# Patient Record
Sex: Female | Born: 1950 | Race: White | Hispanic: No | Marital: Married | Smoking: Never smoker
Health system: Southern US, Community
[De-identification: ages and names within clinical notes are randomized; demographics above are authoritative.]

## PROBLEM LIST (undated history)

## (undated) DIAGNOSIS — K635 Polyp of colon: Secondary | ICD-10-CM

## (undated) DIAGNOSIS — K219 Gastro-esophageal reflux disease without esophagitis: Secondary | ICD-10-CM

## (undated) DIAGNOSIS — I341 Nonrheumatic mitral (valve) prolapse: Secondary | ICD-10-CM

## (undated) HISTORY — DX: Polyp of colon: K63.5

## (undated) HISTORY — DX: Gastro-esophageal reflux disease without esophagitis: K21.9

## (undated) HISTORY — PX: TONSILLECTOMY: SUR1361

## (undated) HISTORY — PX: APPENDECTOMY: SHX54

## (undated) HISTORY — DX: Nonrheumatic mitral (valve) prolapse: I34.1

---

## 1998-03-13 ENCOUNTER — Other Ambulatory Visit: Admission: RE | Admit: 1998-03-13 | Discharge: 1998-03-13 | Payer: Self-pay | Admitting: Gynecology

## 1998-07-31 ENCOUNTER — Other Ambulatory Visit: Admission: RE | Admit: 1998-07-31 | Discharge: 1998-07-31 | Payer: Self-pay | Admitting: Gynecology

## 1999-06-04 ENCOUNTER — Other Ambulatory Visit: Admission: RE | Admit: 1999-06-04 | Discharge: 1999-06-04 | Payer: Self-pay | Admitting: Gynecology

## 2000-06-09 ENCOUNTER — Other Ambulatory Visit: Admission: RE | Admit: 2000-06-09 | Discharge: 2000-06-09 | Payer: Self-pay | Admitting: Gynecology

## 2000-07-08 ENCOUNTER — Other Ambulatory Visit: Admission: RE | Admit: 2000-07-08 | Discharge: 2000-07-08 | Payer: Self-pay | Admitting: Gynecology

## 2000-07-08 ENCOUNTER — Encounter (INDEPENDENT_AMBULATORY_CARE_PROVIDER_SITE_OTHER): Payer: Self-pay | Admitting: Specialist

## 2008-03-15 ENCOUNTER — Encounter: Admission: RE | Admit: 2008-03-15 | Discharge: 2008-03-15 | Payer: Self-pay | Admitting: Obstetrics and Gynecology

## 2008-08-26 ENCOUNTER — Emergency Department (HOSPITAL_COMMUNITY): Admission: EM | Admit: 2008-08-26 | Discharge: 2008-08-26 | Payer: Self-pay | Admitting: Emergency Medicine

## 2009-10-07 IMAGING — CR DG CHEST 1V PORT
1 series · 1 of 1 positions shown · non-contrast
Comparison: None

CLINICAL DATA: Chest pain

PORTABLE CHEST - 1 VIEW

[view not recorded]
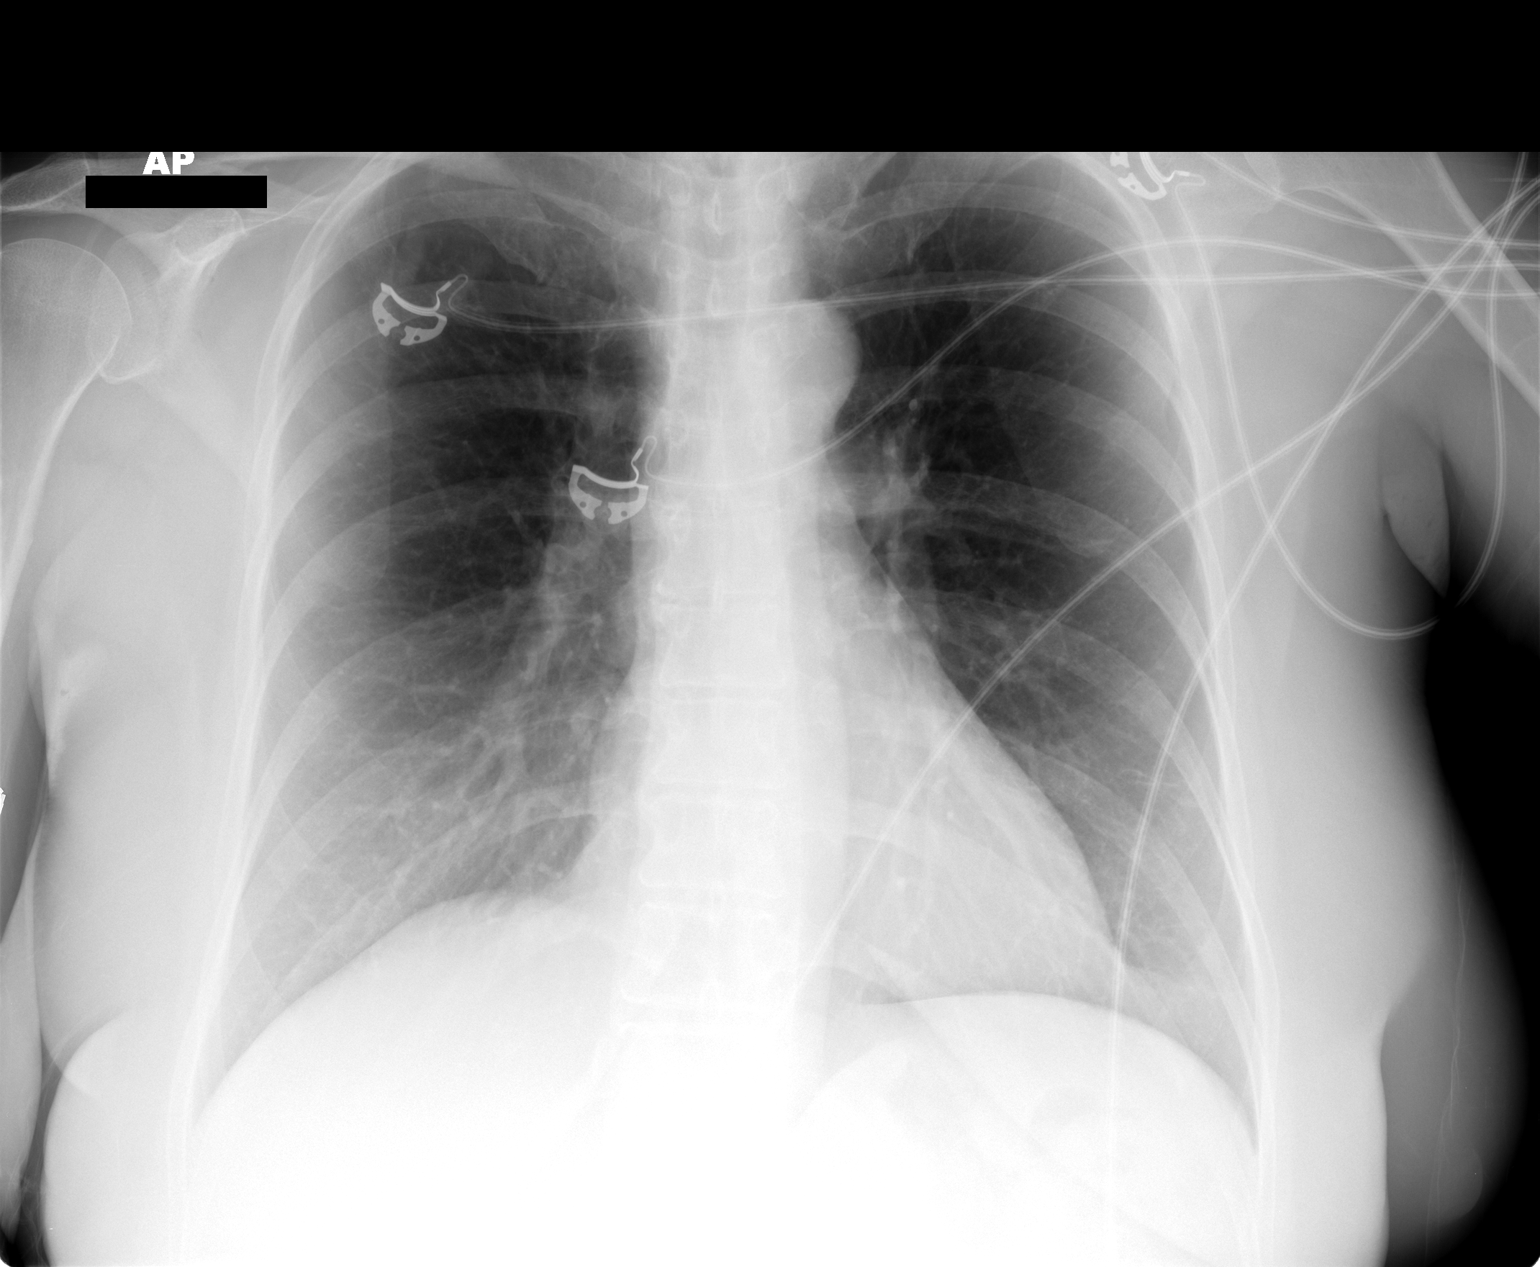

[1 of 1 positions shown; findings below may reference images not displayed]

FINDINGS: Cardiomediastinal silhouette is unremarkable.  No acute
infiltrate or pleural effusion.  No pulmonary edema.  Bony thorax
is unremarkable.
IMPRESSION: No active disease.

## 2010-07-15 LAB — CBC
MCHC: 35.2 g/dL (ref 30.0–36.0)
Platelets: 210 10*3/uL (ref 150–400)
RBC: 4.51 MIL/uL (ref 3.87–5.11)
WBC: 6.4 10*3/uL (ref 4.0–10.5)

## 2010-07-15 LAB — COMPREHENSIVE METABOLIC PANEL
ALT: 22 U/L (ref 0–35)
AST: 22 U/L (ref 0–37)
Albumin: 4.5 g/dL (ref 3.5–5.2)
CO2: 28 mEq/L (ref 19–32)
Calcium: 9.7 mg/dL (ref 8.4–10.5)
Chloride: 105 mEq/L (ref 96–112)
GFR calc Af Amer: >60 mL/min (ref >60)
GFR calc non Af Amer: >60 mL/min (ref >60)
Sodium: 140 mEq/L (ref 135–145)

## 2010-07-15 LAB — POCT CARDIAC MARKERS: Troponin i, poc: <0.05 ng/mL (ref 0.00–0.09)

## 2010-07-15 LAB — DIFFERENTIAL
Eosinophils Absolute: 0.2 10*3/uL (ref 0.0–0.7)
Eosinophils Relative: 3 % (ref 0–5)
Lymphs Abs: 2.2 10*3/uL (ref 0.7–4.0)
Monocytes Absolute: 0.5 10*3/uL (ref 0.1–1.0)

## 2013-08-09 ENCOUNTER — Encounter (INDEPENDENT_AMBULATORY_CARE_PROVIDER_SITE_OTHER): Payer: Self-pay | Admitting: *Deleted

## 2013-08-11 ENCOUNTER — Encounter (INDEPENDENT_AMBULATORY_CARE_PROVIDER_SITE_OTHER): Payer: Self-pay | Admitting: Internal Medicine

## 2013-08-11 ENCOUNTER — Ambulatory Visit (INDEPENDENT_AMBULATORY_CARE_PROVIDER_SITE_OTHER): Payer: BC Managed Care – PPO | Admitting: Internal Medicine

## 2013-08-11 VITALS — BP 130/70 | HR 60 | Temp 98.3°F | Ht 67.0 in | Wt 179.3 lb

## 2013-08-11 DIAGNOSIS — K2270 Barrett's esophagus without dysplasia: Secondary | ICD-10-CM | POA: Insufficient documentation

## 2013-08-11 DIAGNOSIS — I341 Nonrheumatic mitral (valve) prolapse: Secondary | ICD-10-CM | POA: Insufficient documentation

## 2013-08-11 DIAGNOSIS — K219 Gastro-esophageal reflux disease without esophagitis: Secondary | ICD-10-CM | POA: Insufficient documentation

## 2013-08-11 MED ORDER — PANTOPRAZOLE SODIUM 40 MG PO TBEC: 40.0000 mg | DELAYED_RELEASE_TABLET | Freq: Every day | ORAL | Status: DC

## 2013-08-11 NOTE — Progress Notes (Signed)
Subjective:     Patient ID: Gloria RumpfJanice M Castro, female   DOB: Oct 22, 1950, 63 y.o.   MRN: [id number]  HPI Referred to our office by Dr. Esperanza RichtersGretchen Atkins for GI. Prior patient of Dr. Elder CyphersShiflett. 11/10/2005 Colonoscopy; Hyperplastic polyp transverse colon.  07/08/2012 EGD: Large hiatal hernia and there is obvious short segment Barrett's, columnar epithelium cm above the Z-line. Biopsies of the columnar area. No active erosions. Stomach, pyloric channel, and duodenum otherwise normal to the second and 3rd portions of the duodenum. She tells me the biopsy showed it was not Barrett's.  Biopsy: benign fragments of mildly chronically inflamed GE mucosa, distal esophagus. 2014 Colonoscopy: Normal. Appetite is good.  No unintentional weight loss. Acid reflux controlled with PPI. She says she is always hoarse in the am.  She sleeps on a wedge pillow.    Review of Systems Married, 3 girls in good health. Works as a Armed forces operational officerdental hygienist     Past Medical History  Diagnosis Date  . Colon polyps   . MVP (mitral valve prolapse)   . GERD (gastroesophageal reflux disease)     Past Surgical History  Procedure Laterality Date  . Appendectomy    . Tonsillectomy      Allergies  Allergen Reactions  . Augmentin [Amoxicillin-Pot Clavulanate]     No current outpatient prescriptions on file prior to visit.   No current facility-administered medications on file prior to visit.     Objective:   Physical Exam  Filed Vitals:   08/11/13 0909  BP: 130/70  Pulse: 60  Temp: 98.3 F (36.8 C)  Height: 5\' 7"  (1.702 m)  Weight: 179 lb 4.8 oz (81.33 kg)   Alert and oriented. Skin warm and dry. Oral mucosa is moist.   . Sclera anicteric, conjunctivae is pink. Thyroid not enlarged. No cervical lymphadenopathy. Lungs clear. Heart regular rate and rhythm.  Abdomen is soft. Bowel sounds are positive. No hepatomegaly. No abdominal masses felt. No tenderness.  No edema to lower extremities.       Assessment:    GERD.   Controlled at this time. She however has hoarseness in the am. Hx of large hiatal hernia. Barrett's esophagus.     Plan:    Am going to discuss with Dr Karilyn Cotaehman. Possible repeat EGD. Biopsy says negative for Barrett's. Dr.Shefliett dictations says obvious Barrett's on EGD. OV in one year. Am going to switch her to Protonix 40mg  daily, #11.

## 2013-08-11 NOTE — Patient Instructions (Signed)
Will discuss with Dr. Karilyn Cotaehman concerning repeat

## 2013-08-17 ENCOUNTER — Telehealth (INDEPENDENT_AMBULATORY_CARE_PROVIDER_SITE_OTHER): Payer: Self-pay | Admitting: Internal Medicine

## 2013-08-17 NOTE — Telephone Encounter (Signed)
2 yr EGD noted

## 2013-08-17 NOTE — Telephone Encounter (Signed)
I advised her of her need for surveillance EGD in 2 yrs.   Ann, please put on a recall for an EGD in 2 yrs. 2017. Dx Barrett's esophagus.

## 2014-01-30 DIAGNOSIS — M75110 Incomplete rotator cuff tear or rupture of unspecified shoulder, not specified as traumatic: Secondary | ICD-10-CM | POA: Insufficient documentation

## 2014-02-13 DIAGNOSIS — M7521 Bicipital tendinitis, right shoulder: Secondary | ICD-10-CM | POA: Insufficient documentation

## 2014-04-03 ENCOUNTER — Encounter (INDEPENDENT_AMBULATORY_CARE_PROVIDER_SITE_OTHER): Payer: Self-pay

## 2015-08-20 ENCOUNTER — Encounter (INDEPENDENT_AMBULATORY_CARE_PROVIDER_SITE_OTHER): Payer: Self-pay | Admitting: *Deleted

## 2016-06-17 ENCOUNTER — Other Ambulatory Visit (INDEPENDENT_AMBULATORY_CARE_PROVIDER_SITE_OTHER): Payer: Self-pay | Admitting: *Deleted

## 2016-06-17 DIAGNOSIS — K2270 Barrett's esophagus without dysplasia: Secondary | ICD-10-CM | POA: Insufficient documentation

## 2016-07-10 ENCOUNTER — Telehealth (INDEPENDENT_AMBULATORY_CARE_PROVIDER_SITE_OTHER): Payer: Self-pay | Admitting: *Deleted

## 2016-07-10 ENCOUNTER — Encounter (INDEPENDENT_AMBULATORY_CARE_PROVIDER_SITE_OTHER): Payer: Self-pay | Admitting: *Deleted

## 2016-07-10 MED ORDER — PEG 3350-KCL-NA BICARB-NACL 420 G PO SOLR
4000.0000 mL | Freq: Once | ORAL | 0 refills | Status: AC
Start: 2016-07-10 — End: 2016-07-10

## 2016-07-10 NOTE — Telephone Encounter (Signed)
Patient needs trilyte 

## 2016-07-21 ENCOUNTER — Telehealth (INDEPENDENT_AMBULATORY_CARE_PROVIDER_SITE_OTHER): Payer: Self-pay | Admitting: *Deleted

## 2016-07-21 NOTE — Telephone Encounter (Signed)
Referring MD/PCP: davis   Procedure: egd  Reason/Indication:  Barrett's esophagus  Has patient had this procedure before?  Yes, 2014 -- scanned  If so, when, by whom and where?    Is there a family history of colon cancer?    Who?  What age when diagnosed?    Is patient diabetic?   no      Does patient have prosthetic heart valve or mechanical valve?  no  Do you have a pacemaker?  no  Has patient ever had endocarditis? no  Has patient had joint replacement within last 12 months?  no  Does patient tend to be constipated or take laxatives?   Does patient have a history of alcohol/drug use?    Is patient on Coumadin, Plavix and/or Aspirin? yes  Medications: asa 81 mg daily, atorvastatin 20 mg 2 tab weekly  Allergies: augmentin  Medication Adjustment per Dr Karilyn Cota: asa 2 days  Procedure date & time: 08/19/16 at 1200

## 2016-07-22 ENCOUNTER — Encounter (INDEPENDENT_AMBULATORY_CARE_PROVIDER_SITE_OTHER): Payer: Self-pay | Admitting: *Deleted

## 2016-07-22 NOTE — Telephone Encounter (Signed)
agree

## 2016-08-19 ENCOUNTER — Ambulatory Visit (HOSPITAL_COMMUNITY)
Admission: RE | Admit: 2016-08-19 | Discharge: 2016-08-19 | Disposition: A | Discharge status: Home / Self Care | Payer: Medicare Other | Source: Ambulatory Visit | Attending: Internal Medicine | Admitting: Internal Medicine

## 2016-08-19 ENCOUNTER — Encounter (HOSPITAL_COMMUNITY)
Admission: RE | Disposition: A | Discharge status: Home / Self Care | Payer: Self-pay | Source: Ambulatory Visit | Attending: Internal Medicine

## 2016-08-19 ENCOUNTER — Encounter (HOSPITAL_COMMUNITY): Payer: Self-pay | Admitting: *Deleted

## 2016-08-19 DIAGNOSIS — Z79899 Other long term (current) drug therapy: Secondary | ICD-10-CM | POA: Insufficient documentation

## 2016-08-19 DIAGNOSIS — K219 Gastro-esophageal reflux disease without esophagitis: Secondary | ICD-10-CM | POA: Insufficient documentation

## 2016-08-19 DIAGNOSIS — Z7982 Long term (current) use of aspirin: Secondary | ICD-10-CM | POA: Insufficient documentation

## 2016-08-19 DIAGNOSIS — K2270 Barrett's esophagus without dysplasia: Secondary | ICD-10-CM | POA: Insufficient documentation

## 2016-08-19 DIAGNOSIS — K228 Other specified diseases of esophagus: Secondary | ICD-10-CM

## 2016-08-19 HISTORY — PX: ESOPHAGOGASTRODUODENOSCOPY: SHX5428

## 2016-08-19 SURGERY — EGD (ESOPHAGOGASTRODUODENOSCOPY)
Anesthesia: Moderate Sedation

## 2016-08-19 MED ORDER — MEPERIDINE HCL 50 MG/ML IJ SOLN
INTRAMUSCULAR | Status: AC
  Filled 2016-08-19: qty 1

## 2016-08-19 MED ORDER — MEPERIDINE HCL 50 MG/ML IJ SOLN
INTRAMUSCULAR | Status: DC | PRN
  Administered 2016-08-19 (×2): 25 mg via INTRAVENOUS

## 2016-08-19 MED ORDER — LIDOCAINE VISCOUS 2 % MT SOLN
OROMUCOSAL | Status: AC
  Filled 2016-08-19: qty 15

## 2016-08-19 MED ORDER — SODIUM CHLORIDE 0.9 % IV SOLN
INTRAVENOUS | Status: DC
  Administered 2016-08-19: 1000 mL via INTRAVENOUS

## 2016-08-19 MED ORDER — LIDOCAINE HCL 2 % EX GEL
CUTANEOUS | Status: DC | PRN
  Administered 2016-08-19: 1

## 2016-08-19 MED ORDER — MIDAZOLAM HCL 5 MG/5ML IJ SOLN
INTRAMUSCULAR | Status: DC | PRN
  Administered 2016-08-19 (×2): 2 mg via INTRAVENOUS

## 2016-08-19 MED ORDER — MIDAZOLAM HCL 5 MG/5ML IJ SOLN
INTRAMUSCULAR | Status: AC
  Filled 2016-08-19: qty 10

## 2016-08-19 NOTE — Op Note (Signed)
[facility] Patient Name: Gloria Castro Procedure Date: 08/19/2016 10:44 AM MRN: [id number] Date of Birth: 13-Apr-1950 Attending MD: Lionel December , MD CSN: [id number] Age: 66 Admit Type: Outpatient Procedure:                Upper GI endoscopy Indications:              Barrett's esophagus, Follow-up of Barrett's                            esophagus Providers:                Lionel December, MD, Toniann Fail RN, RN, Dyann Ruddle Referring MD:             Lamont Snowball, MD Medicines:                Lidocaine spray, Meperidine 50 mg IV, Midazolam 4                            mg IV Complications:            No immediate complications. Estimated Blood Loss:     Estimated blood loss was minimal. Procedure:                Pre-Anesthesia Assessment:                           - Prior to the procedure, a History and Physical                            was performed, and patient medications and                            allergies were reviewed. The patient's tolerance of                            previous anesthesia was also reviewed. The risks                            and benefits of the procedure and the sedation                            options and risks were discussed with the patient.                            All questions were answered, and informed consent                            was obtained. Prior Anticoagulants: The patient                            last took aspirin 3 days prior to the procedure.  ASA Grade Assessment: II - A patient with mild                            systemic disease. After reviewing the risks and                            benefits, the patient was deemed in satisfactory                            condition to undergo the procedure.                           After obtaining informed consent, the endoscope was                            passed under direct vision. Throughout the                  procedure, the patient's blood pressure, pulse, and                            oxygen saturations were monitored continuously. The                            EG-[id number] ([id number]) scope was introduced through the                            mouth, and advanced to the second part of duodenum.                            The upper GI endoscopy was accomplished without                            difficulty. The patient tolerated the procedure                            well. Scope In: 11:23:17 AM Scope Out: 11:30:59 AM Total Procedure Duration: 0 hours 7 minutes 42 seconds  Findings:      The proximal esophagus and mid esophagus were normal.      One tongue of salmon-colored mucosa was present from 38 to 39 cm. The       maximum longitudinal extent of these esophageal mucosal changes was 1 cm       in length. Biopsies were taken with a cold forceps for histology.      The Z-line was irregular and was found 39 cm from the incisors.      The entire examined stomach was normal.      The duodenal bulb and second portion of the duodenum were normal. Impression:               - Normal proximal esophagus and mid esophagus.                           - Salmon-colored mucosa suspicious for  short-segment Barrett's esophagus. Biopsied.                           - Z-line irregular, 39 cm from the incisors.                           - Normal stomach.                           - Normal duodenal bulb and second portion of the                            duodenum. Moderate Sedation:      Moderate (conscious) sedation was administered by the endoscopy nurse       and supervised by the endoscopist. The following parameters were       monitored: oxygen saturation, heart rate, blood pressure, CO2       capnography and response to care. Total physician intraservice time was       14 minutes. Recommendation:           - Patient has a contact number available for                             emergencies. The signs and symptoms of potential                            delayed complications were discussed with the                            patient. Return to normal activities tomorrow.                            Written discharge instructions were provided to the                            patient.                           - Resume previous diet today.                           - Continue present medications.                           - Resume aspirin at prior dose tomorrow.                           - Await pathology results.                           - Repeat upper endoscopy for surveillance based on                            pathology results. Procedure Code(s):        --- Professional ---  [id number], Esophagogastroduodenoscopy, flexible,                            transoral; with biopsy, single or multiple                           99152, Moderate sedation services provided by the                            same physician or other qualified health care                            professional performing the diagnostic or                            therapeutic service that the sedation supports,                            requiring the presence of an independent trained                            observer to assist in the monitoring of the                            patient's level of consciousness and physiological                            status; initial 15 minutes of intraservice time,                            patient age 8 years or older Diagnosis Code(s):        --- Professional ---                           K22.70, Barrett's esophagus without dysplasia                           K22.8, Other specified diseases of esophagus CPT copyright 2016 American Medical Association. All rights reserved. The codes documented in this report are preliminary and upon coder review may  be revised to meet current compliance requirements. Lionel DecemberNajeeb  Lewayne Pauley, MD Lionel DecemberNajeeb Mykeria Garman, MD 08/19/2016 11:47:51 AM This report has been signed electronically. Number of Addenda: 0

## 2016-08-19 NOTE — Discharge Instructions (Signed)
Resume aspirin on 08/20/2016. Resume other medications and diet as before. No driving for 24 hours. Physician will call with biopsy results.     Esophagogastroduodenoscopy, Care After Refer to this sheet in the next few weeks. These instructions provide you with information about caring for yourself after your procedure. Your health care provider may also give you more specific instructions. Your treatment has been planned according to current medical practices, but problems sometimes occur. Call your health care provider if you have any problems or questions after your procedure. What can I expect after the procedure? After the procedure, it is common to have:  A sore throat.  Nausea.  Bloating.  Dizziness.  Fatigue. Follow these instructions at home:  Do not eat or drink anything until the numbing medicine (local anesthetic) has worn off and your gag reflex has returned. You will know that the local anesthetic has worn off when you can swallow comfortably.  Do not drive for 24 hours if you received a medicine to help you relax (sedative).  If your health care provider took a tissue sample for testing during the procedure, make sure to get your test results. This is your responsibility. Ask your health care provider or the department performing the test when your results will be ready.  Keep all follow-up visits as told by your health care provider. This is important. Contact a health care provider if:  You cannot stop coughing.  You are not urinating.  You are urinating less than usual. Get help right away if:  You have trouble swallowing.  You cannot eat or drink.  You have throat or chest pain that gets worse.  You are dizzy or light-headed.  You faint.  You have nausea or vomiting.  You have chills.  You have a fever.  You have severe abdominal pain.  You have black, tarry, or bloody stools. This information is not intended to replace advice given to  you by your health care provider. Make sure you discuss any questions you have with your health care provider. Document Released: 03/09/2012 Document Revised: 08/29/2015 Document Reviewed: 02/14/2015 Elsevier Interactive Patient Education  2017 [employer] Inc.    Hiatal Hernia A hiatal hernia occurs when part of the stomach slides above the muscle that separates the abdomen from the chest (diaphragm). A person can be born with a hiatal hernia (congenital), or it may develop over time. In almost all cases of hiatal hernia, only the top part of the stomach pushes through the diaphragm. Many people have a hiatal hernia with no symptoms. The larger the hernia, the more likely it is that you will have symptoms. In some cases, a hiatal hernia allows stomach acid to flow back into the tube that carries food from your mouth to your stomach (esophagus). This may cause heartburn symptoms. Severe heartburn symptoms may mean that you have developed a condition called gastroesophageal reflux disease (GERD). What are the causes? This condition is caused by a weakness in the opening (hiatus) where the esophagus passes through the diaphragm to attach to the upper part of the stomach. A person may be born with a weakness in the hiatus, or a weakness can develop over time. What increases the risk? This condition is more likely to develop in:  Older people. Age is a major risk factor for a hiatal hernia, especially if you are over the age of 65.  Pregnant women.  People who are overweight.  People who have frequent constipation. What are the signs or  symptoms? Symptoms of this condition usually develop in the form of GERD symptoms. Symptoms include:  Heartburn.  Belching.  Indigestion.  Trouble swallowing.  Coughing or wheezing.  Sore throat.  Hoarseness.  Chest pain.  Nausea and vomiting. How is this diagnosed? This condition may be diagnosed during testing for GERD. Tests that may be done  include:  X-rays of your stomach or chest.  An upper gastrointestinal (GI) series. This is an X-ray exam of your GI tract that is taken after you swallow a chalky liquid that shows up clearly on the X-ray.  Endoscopy. This is a procedure to look into your stomach using a thin, flexible tube that has a tiny camera and light on the end of it. How is this treated? This condition may be treated by:  Dietary and lifestyle changes to help reduce GERD symptoms.  Medicines. These may include:  Over-the-counter antacids.  Medicines that make your stomach empty more quickly.  Medicines that block the production of stomach acid (H2 blockers).  Stronger medicines to reduce stomach acid (proton pump inhibitors).  Surgery to repair the hernia, if other treatments are not helping. If you have no symptoms, you may not need treatment. Follow these instructions at home: Lifestyle and activity   Do not use any products that contain nicotine or tobacco, such as cigarettes and e-cigarettes. If you need help quitting, ask your health care provider.  Try to achieve and maintain a healthy body weight.  Avoid putting pressure on your abdomen. Anything that puts pressure on your abdomen increases the amount of acid that may be pushed up into your esophagus.  Avoid bending over, especially after eating.  Raise the head of your bed by putting blocks under the legs. This keeps your head and esophagus higher than your stomach.  Do not wear tight clothing around your chest or stomach.  Try not to strain when having a bowel movement, when urinating, or when lifting heavy objects. Eating and drinking   Avoid foods that can worsen GERD symptoms. These may include:  Fatty foods, like fried foods.  Citrus fruits, like oranges or lemon.  Other foods and drinks that contain acid, like orange juice or tomatoes.  Spicy food.  Chocolate.  Eat frequent small meals instead of three large meals a day. This  helps prevent your stomach from getting too full.  Eat slowly.  Do not lie down right after eating.  Do not eat 1-2 hours before bed.  Do not drink beverages with caffeine. These include cola, coffee, cocoa, and tea.  Do not drink alcohol. General instructions   Take over-the-counter and prescription medicines only as told by your health care provider.  Keep all follow-up visits as told by your health care provider. This is important. Contact a health care provider if:  Your symptoms are not controlled with medicines or lifestyle changes.  You are having trouble swallowing.  You have coughing or wheezing that will not go away. Get help right away if:  Your pain is getting worse.  Your pain spreads to your arms, neck, jaw, teeth, or back.  You have shortness of breath.  You sweat for no reason.  You feel sick to your stomach (nauseous) or you vomit.  You vomit blood.  You have bright red blood in your stools.  You have black, tarry stools. This information is not intended to replace advice given to you by your health care provider. Make sure you discuss any questions you have with your health  care provider. Document Released: 06/13/2003 Document Revised: 03/16/2016 Document Reviewed: 03/16/2016 Elsevier Interactive Patient Education  2017 [employer].

## 2016-08-19 NOTE — H&P (Signed)
Gloria RumpfJanice M Castro is an 66 y.o. female.   Chief Complaint: Patient is here for EGD. HPI: Patient is 66 year old Caucasian female was history of GERD, GERD with Barrett's esophagus who is here for surveillance colonoscopy. She states when her condition was diagnosed she did not have heartburn had coffee should not go away. Last EGD was in April 2014. She denies dysphagia nausea vomiting abdominal pain or melena. She does not smoke cigarettes or drink alcohol.  Past Medical History:  Diagnosis Date  . Colon polyps   . GERD (gastroesophageal reflux disease)   . MVP (mitral valve prolapse)     Past Surgical History:  Procedure Laterality Date  . APPENDECTOMY    . TONSILLECTOMY      History reviewed. No pertinent family history. Social History:  reports that she has never smoked. She has never used smokeless tobacco. She reports that she does not drink alcohol or use drugs.  Allergies:  Allergies  Allergen Reactions  . Augmentin [Amoxicillin-Pot Clavulanate]     Medications Prior to Admission  Medication Sig Dispense Refill  . aspirin EC 81 MG tablet Take 81 mg by mouth daily.    Marland Kitchen. atorvastatin (LIPITOR) 20 MG tablet Take 10 mg by mouth 4 (four) times a week.    . cholecalciferol (VITAMIN D) 1000 units tablet Take 1,000 Units by mouth daily.    Marland Kitchen. estradiol (ESTRACE) 0.1 MG/GM vaginal cream Place 1 Applicatorful vaginally 2 (two) times a week.    . famotidine (PEPCID) 40 MG tablet Take 40 mg by mouth 2 (two) times daily.    . Omega-3 Fatty Acids (FISH OIL) 1000 MG CAPS Take 1 capsule by mouth daily.    . Turmeric 500 MG CAPS Take 1 capsule by mouth daily.      No results found for this or any previous visit (from the past 48 hour(s)). No results found.  ROS  Blood pressure 128/69, pulse (!) 57, temperature 97.8 F (36.6 C), temperature source Oral, resp. rate 19, height 5\' 7"  (1.702 m), weight 175 lb (79.4 kg), SpO2 100 %. Physical Exam  Constitutional: She appears  well-developed and well-nourished.  HENT:  Mouth/Throat: Oropharynx is clear and moist.  Eyes: Conjunctivae are normal. No scleral icterus.  Neck: No thyromegaly present.  Cardiovascular: Normal rate, regular rhythm and normal heart sounds.   No murmur heard. Respiratory: Effort normal and breath sounds normal.  GI: Soft. She exhibits no distension and no mass. There is no tenderness.  Musculoskeletal: She exhibits no edema.  Lymphadenopathy:    She has no cervical adenopathy.  Neurological: She is alert.  Skin: Skin is warm and dry.     Assessment/Plan History of Barrett's esophagus. Evidence EGD.  Lionel DecemberNajeeb Phyllis Abelson, MD 08/19/2016, 11:15 AM

## 2016-08-25 ENCOUNTER — Encounter (HOSPITAL_COMMUNITY): Payer: Self-pay | Admitting: Internal Medicine

## 2016-08-27 ENCOUNTER — Telehealth (INDEPENDENT_AMBULATORY_CARE_PROVIDER_SITE_OTHER): Payer: Self-pay | Admitting: *Deleted

## 2016-08-27 NOTE — Telephone Encounter (Signed)
Patient was called and a message was left for her to call office regarding results.

## 2019-12-06 DIAGNOSIS — R87619 Unspecified abnormal cytological findings in specimens from cervix uteri: Secondary | ICD-10-CM | POA: Insufficient documentation

## 2019-12-06 DIAGNOSIS — M8580 Other specified disorders of bone density and structure, unspecified site: Secondary | ICD-10-CM | POA: Insufficient documentation

## 2020-05-02 ENCOUNTER — Encounter: Payer: Self-pay | Admitting: Podiatry

## 2020-05-02 ENCOUNTER — Ambulatory Visit: Payer: Medicare Other

## 2020-05-02 ENCOUNTER — Ambulatory Visit (INDEPENDENT_AMBULATORY_CARE_PROVIDER_SITE_OTHER): Payer: Medicare Other | Admitting: Podiatry

## 2020-05-02 ENCOUNTER — Other Ambulatory Visit: Payer: Self-pay

## 2020-05-02 ENCOUNTER — Encounter: Payer: PRIVATE HEALTH INSURANCE | Admitting: Podiatry

## 2020-05-02 ENCOUNTER — Ambulatory Visit (INDEPENDENT_AMBULATORY_CARE_PROVIDER_SITE_OTHER): Payer: Medicare Other

## 2020-05-02 DIAGNOSIS — M19071 Primary osteoarthritis, right ankle and foot: Secondary | ICD-10-CM | POA: Diagnosis not present

## 2020-05-02 DIAGNOSIS — E7800 Pure hypercholesterolemia, unspecified: Secondary | ICD-10-CM | POA: Insufficient documentation

## 2020-05-02 DIAGNOSIS — M778 Other enthesopathies, not elsewhere classified: Secondary | ICD-10-CM

## 2020-05-03 NOTE — Progress Notes (Signed)
  Subjective:  Patient ID: Gloria Castro, female    DOB: November 02, 1950,  MRN: [id number] HPI Chief Complaint  Patient presents with  . Foot Pain    Dorsal midfoot right - initially had heel pain plantarly last year, got an injection by Ortho, now pain across the top of the foot x few months, tried ice and Ibuprofen  . New Patient (Initial Visit)    70 y.o. female presents with the above complaint.   ROS: Denies fever chills nausea vomiting muscle aches pains calf pain back pain chest pain shortness of breath.  Past Medical History:  Diagnosis Date  . Colon polyps   . GERD (gastroesophageal reflux disease)   . MVP (mitral valve prolapse)    Past Surgical History:  Procedure Laterality Date  . APPENDECTOMY    . ESOPHAGOGASTRODUODENOSCOPY N/A 08/19/2016   Procedure: ESOPHAGOGASTRODUODENOSCOPY (EGD);  Surgeon: Malissa Hippo, MD;  Location: [facility];  Service: Endoscopy;  Laterality: N/A;  1200  . TONSILLECTOMY      Current Outpatient Medications:  .  atorvastatin (LIPITOR) 20 MG tablet, Take 10 mg by mouth 4 (four) times a week., Disp: , Rfl:  .  cholecalciferol (VITAMIN D) 1000 units tablet, Take 1,000 Units by mouth daily., Disp: , Rfl:  .  estradiol (ESTRACE) 0.1 MG/GM vaginal cream, Place 1 Applicatorful vaginally 2 (two) times a week., Disp: , Rfl:  .  famotidine (PEPCID) 40 MG tablet, Take 40 mg by mouth 2 (two) times daily., Disp: , Rfl:  .  Melatonin 3 MG CAPS, melatonin 3 mg capsule  Take by oral route., Disp: , Rfl:  .  Turmeric 500 MG CAPS, Take 1 capsule by mouth daily., Disp: , Rfl:   Allergies  Allergen Reactions  . Augmentin [Amoxicillin-Pot Clavulanate]    Review of Systems Objective:  There were no vitals filed for this visit.  General: Well developed, nourished, in no acute distress, alert and oriented x3   Dermatological: Skin is warm, dry and supple bilateral. Nails x 10 are well maintained; remaining integument appears unremarkable at this time.  There are no open sores, no preulcerative lesions, no rash or signs of infection present.  Vascular: Dorsalis Pedis artery and Posterior Tibial artery pedal pulses are 2/4 bilateral with immedate capillary fill time. Pedal hair growth present. No varicosities and no lower extremity edema present bilateral.   Neruologic: Grossly intact via light touch bilateral. Vibratory intact via tuning fork bilateral. Protective threshold with Semmes Wienstein monofilament intact to all pedal sites bilateral. Patellar and Achilles deep tendon reflexes 2+ bilateral. No Babinski or clonus noted bilateral.   Musculoskeletal: No gross boney pedal deformities bilateral. No pain, crepitus, or limitation noted with foot and ankle range of motion bilateral. Muscular strength 5/5 in all groups tested bilateral.  Gait: Unassisted, Nonantalgic.    Radiographs:  Radiographs taken today demonstrate an osseously mature individual with moderate osteopenia.  Significant osteoarthritic changes of the right second metatarsal cuneiform joint.  Dorsal spurring is noted.  Joint space narrowing is noted.    Assessment & Plan:   Assessment: Osteoarthritis second TMT joint.    Plan: Discussed options today including oral therapy topical nonsteroidal anti-inflammatory therapy and injection therapy.  We discussed appropriate shoe gear stretching exercise ice therapy shoe gear modifications.  I will follow-up with her on an as-needed basis.  At this point she only wants to know the diagnosis.     Shaleah Nissley T. [place], [place]

## 2020-05-13 NOTE — Progress Notes (Signed)
This encounter was created in error - please disregard.

## 2022-06-23 ENCOUNTER — Encounter (INDEPENDENT_AMBULATORY_CARE_PROVIDER_SITE_OTHER): Payer: Self-pay | Admitting: *Deleted

## 2022-07-08 ENCOUNTER — Telehealth (INDEPENDENT_AMBULATORY_CARE_PROVIDER_SITE_OTHER): Payer: Self-pay | Admitting: Gastroenterology

## 2022-07-08 NOTE — Telephone Encounter (Signed)
Any Room Thanks 

## 2022-07-08 NOTE — Telephone Encounter (Signed)
Who is your primary care physician: [facility]   Reasons for the colonoscopy: hx polyps  Have you had a colonoscopy before?  Yes 10 years ago  Do you have family history of colon cancer? no  Previous colonoscopy with polyps removed? Yes 20 years ago small flat polyp  Do you have a history colorectal cancer?   no  Are you diabetic? If yes, Type 1 or Type 2?    no  Do you have a prosthetic or mechanical heart valve? no  Do you have a pacemaker/defibrillator?   no  Have you had endocarditis/atrial fibrillation? no  Have you had joint replacement within the last 12 months?  no  Do you tend to be constipated or have to use laxatives? no  Do you have any history of drugs or alchohol?  no  Do you use supplemental oxygen?  no  Have you had a stroke or heart attack within the last 6 months? no  Do you take weight loss medication?  no  For female patients: have you had a hysterectomy?  no                                     are you post menopausal?       yes                                            do you still have your menstrual cycle? no      Do you take any blood-thinning medications such as: (aspirin, warfarin, Plavix, Aggrenox)  no  If yes we need the name, milligram, dosage and who is prescribing doctor  Current Outpatient Medications on File Prior to Visit  Medication Sig Dispense Refill   atorvastatin (LIPITOR) 20 MG tablet Take 10 mg by mouth 4 (four) times a week.     cholecalciferol (VITAMIN D) 1000 units tablet Take 1,000 Units by mouth daily.     estradiol (ESTRACE) 0.1 MG/GM vaginal cream Place 1 Applicatorful vaginally 2 (two) times a week.     famotidine (PEPCID) 40 MG tablet Take 40 mg by mouth 2 (two) times daily.     Melatonin 3 MG CAPS melatonin 3 mg capsule  Take by oral route.     Turmeric 500 MG CAPS Take 1 capsule by mouth daily.     No current facility-administered medications on file prior to visit.    Allergies  Allergen Reactions    Augmentin [Amoxicillin-Pot Clavulanate]      Pharmacy: [place]  Primary Insurance Name: Medicare, Pollie Meyer  Best number where you can be reached: [phone]

## 2022-07-09 MED ORDER — PEG 3350-KCL-NA BICARB-NACL 420 G PO SOLR: 4000.0000 mL | Freq: Once | ORAL | 0 refills | Status: AC

## 2022-07-09 NOTE — Telephone Encounter (Signed)
Left message to return call 

## 2022-07-09 NOTE — Telephone Encounter (Signed)
Pt returned call. Colonoscopy scheduled. Instructions mailed to patient. Prep sent to pharmacy.

## 2022-07-09 NOTE — Addendum Note (Signed)
Addended by: Vicente Males on: 07/09/2022 10:52 AM   Modules accepted: Orders

## 2022-07-13 NOTE — Telephone Encounter (Signed)
Questionnaire from recall, no referral needed  

## 2022-08-14 ENCOUNTER — Encounter (HOSPITAL_COMMUNITY)
Admission: RE | Disposition: A | Discharge status: Home / Self Care | Payer: Self-pay | Source: Home / Self Care | Attending: Gastroenterology

## 2022-08-14 ENCOUNTER — Encounter (HOSPITAL_COMMUNITY): Payer: Self-pay | Admitting: Gastroenterology

## 2022-08-14 ENCOUNTER — Ambulatory Visit (HOSPITAL_COMMUNITY)
Admission: RE | Admit: 2022-08-14 | Discharge: 2022-08-14 | Disposition: A | Discharge status: Home / Self Care | Payer: Medicare Other | Attending: Gastroenterology | Admitting: Gastroenterology

## 2022-08-14 ENCOUNTER — Ambulatory Visit (HOSPITAL_COMMUNITY): Payer: Medicare Other | Admitting: Certified Registered"

## 2022-08-14 ENCOUNTER — Other Ambulatory Visit: Payer: Self-pay

## 2022-08-14 ENCOUNTER — Ambulatory Visit (HOSPITAL_BASED_OUTPATIENT_CLINIC_OR_DEPARTMENT_OTHER): Payer: Medicare Other | Admitting: Certified Registered"

## 2022-08-14 DIAGNOSIS — Z1211 Encounter for screening for malignant neoplasm of colon: Secondary | ICD-10-CM | POA: Diagnosis present

## 2022-08-14 DIAGNOSIS — D125 Benign neoplasm of sigmoid colon: Secondary | ICD-10-CM | POA: Insufficient documentation

## 2022-08-14 DIAGNOSIS — I341 Nonrheumatic mitral (valve) prolapse: Secondary | ICD-10-CM | POA: Insufficient documentation

## 2022-08-14 DIAGNOSIS — Z79899 Other long term (current) drug therapy: Secondary | ICD-10-CM | POA: Insufficient documentation

## 2022-08-14 DIAGNOSIS — K219 Gastro-esophageal reflux disease without esophagitis: Secondary | ICD-10-CM | POA: Diagnosis not present

## 2022-08-14 DIAGNOSIS — D120 Benign neoplasm of cecum: Secondary | ICD-10-CM

## 2022-08-14 DIAGNOSIS — D126 Benign neoplasm of colon, unspecified: Secondary | ICD-10-CM

## 2022-08-14 DIAGNOSIS — D123 Benign neoplasm of transverse colon: Secondary | ICD-10-CM | POA: Diagnosis not present

## 2022-08-14 HISTORY — PX: COLONOSCOPY WITH PROPOFOL: SHX5780

## 2022-08-14 HISTORY — PX: POLYPECTOMY: SHX5525

## 2022-08-14 LAB — HM COLONOSCOPY

## 2022-08-14 SURGERY — COLONOSCOPY WITH PROPOFOL
Anesthesia: General

## 2022-08-14 MED ORDER — LIDOCAINE HCL (CARDIAC) PF 100 MG/5ML IV SOSY
PREFILLED_SYRINGE | INTRAVENOUS | Status: DC | PRN
  Administered 2022-08-14: 50 mg via INTRAVENOUS

## 2022-08-14 MED ORDER — PROPOFOL 10 MG/ML IV BOLUS
INTRAVENOUS | Status: DC | PRN
  Administered 2022-08-14 (×2): 50 mg via INTRAVENOUS
  Administered 2022-08-14: 100 mg via INTRAVENOUS

## 2022-08-14 MED ORDER — LACTATED RINGERS IV SOLN: INTRAVENOUS | Status: DC

## 2022-08-14 MED ORDER — LACTATED RINGERS IV SOLN: INTRAVENOUS | Status: DC | PRN

## 2022-08-14 MED ORDER — PROPOFOL 500 MG/50ML IV EMUL
INTRAVENOUS | Status: DC | PRN
  Administered 2022-08-14: 150 ug/kg/min via INTRAVENOUS

## 2022-08-14 NOTE — Discharge Instructions (Signed)
You are being discharged to home.  Resume your previous diet.  We are waiting for your pathology results.  Your physician has recommended a repeat colonoscopy for surveillance based on pathology results.  

## 2022-08-14 NOTE — Anesthesia Preprocedure Evaluation (Signed)
Anesthesia Evaluation  Patient identified by MRN, date of birth, ID band Patient awake    Reviewed: Allergy & Precautions, H&P , NPO status , Patient's Chart, lab work & pertinent test results, reviewed documented beta blocker date and time   Airway Mallampati: II  TM Distance: >3 FB Neck ROM: full    Dental no notable dental hx.    Pulmonary neg pulmonary ROS   Pulmonary exam normal breath sounds clear to auscultation       Cardiovascular Exercise Tolerance: Good negative cardio ROS  Rhythm:regular Rate:Normal     Neuro/Psych negative neurological ROS  negative psych ROS   GI/Hepatic negative GI ROS, Neg liver ROS,GERD  ,,  Endo/Other  negative endocrine ROS    Renal/GU negative Renal ROS  negative genitourinary   Musculoskeletal   Abdominal   Peds  Hematology negative hematology ROS (+)   Anesthesia Other Findings   Reproductive/Obstetrics negative OB ROS                             Anesthesia Physical Anesthesia Plan  ASA: 2  Anesthesia Plan: General   Post-op Pain Management:    Induction:   PONV Risk Score and Plan: Propofol infusion  Airway Management Planned:   Additional Equipment:   Intra-op Plan:   Post-operative Plan:   Informed Consent: I have reviewed the patients History and Physical, chart, labs and discussed the procedure including the risks, benefits and alternatives for the proposed anesthesia with the patient or authorized representative who has indicated his/her understanding and acceptance.     Dental Advisory Given  Plan Discussed with: CRNA  Anesthesia Plan Comments:        Anesthesia Quick Evaluation  

## 2022-08-14 NOTE — Transfer of Care (Signed)
Immediate Anesthesia Transfer of Care Note  Patient: Gloria Castro  Procedure(s) Performed: COLONOSCOPY WITH PROPOFOL POLYPECTOMY  Patient Location: Endoscopy Unit  Anesthesia Type:General  Level of Consciousness: drowsy  Airway & Oxygen Therapy: Patient Spontanous Breathing  Post-op Assessment: Report given to RN and Post -op Vital signs reviewed and stable  Post vital signs: Reviewed and stable  Last Vitals:  Vitals Value Taken Time  BP    Temp    Pulse    Resp    SpO2      Last Pain:  Vitals:   08/14/22 0732  TempSrc:   PainSc: 0-No pain      Patients Stated Pain Goal: 7 (08/14/22 0644)  Complications: No notable events documented.

## 2022-08-14 NOTE — H&P (Signed)
Gloria Castro is an 72 y.o. female.   Chief Complaint: CRC screening HPI: 72 y/o F with PMH GERD, coming for screening colonoscopy.  Last colonoscopy in 2014 was within normal limits.  The patient denies having any complaints such as melena, hematochezia, abdominal pain or distention, change in her bowel movement consistency or frequency, no changes in weight recently.  No family history of colorectal cancer.   Past Medical History:  Diagnosis Date   Colon polyps    GERD (gastroesophageal reflux disease)    MVP (mitral valve prolapse)     Past Surgical History:  Procedure Laterality Date   APPENDECTOMY     ESOPHAGOGASTRODUODENOSCOPY N/A 08/19/2016   Procedure: ESOPHAGOGASTRODUODENOSCOPY (EGD);  Surgeon: Malissa Hippo, MD;  Location: [facility];  Service: Endoscopy;  Laterality: N/A;  1200   TONSILLECTOMY      History reviewed. No pertinent family history. Social History:  reports that she has never smoked. She has never used smokeless tobacco. She reports that she does not drink alcohol and does not use drugs.  Allergies:  Allergies  Allergen Reactions   Augmentin [Amoxicillin-Pot Clavulanate] Hives    Medications Prior to Admission  Medication Sig Dispense Refill   acetaminophen (TYLENOL) 500 MG tablet Take 1,000 mg by mouth every 6 (six) hours as needed for moderate pain.     atorvastatin (LIPITOR) 20 MG tablet Take 10 mg by mouth 4 (four) times a week.     Brimonidine Tartrate (LUMIFY) 0.025 % SOLN Place 1 drop into both eyes daily as needed (Redness).     cetirizine (ZYRTEC) 10 MG tablet Take 10 mg by mouth daily.     Cholecalciferol (VITAMIN D) 125 MCG (5000 UT) CAPS Take 5,000 Units by mouth daily.     estradiol (ESTRACE) 0.1 MG/GM vaginal cream Place 1 Applicatorful vaginally 2 (two) times a week.     famotidine (PEPCID) 20 MG tablet Take 20 mg by mouth daily.     Melatonin 10 MG TABS Take 10 mg by mouth at bedtime.     oxymetazoline (AFRIN) 0.05 % nasal spray  Place 1 spray into both nostrils 2 (two) times daily as needed for congestion.     Turmeric 500 MG CAPS Take 500 mg by mouth daily.      No results found for this or any previous visit (from the past 48 hour(s)). No results found.  Review of Systems  All other systems reviewed and are negative.   Blood pressure 129/82, pulse 68, temperature (!) 97.5 F (36.4 C), temperature source Oral, resp. rate 17, height 5\' 7"  (1.702 m), weight 82.6 kg, SpO2 96 %. Physical Exam  GENERAL: The patient is AO x3, in no acute distress. HEENT: Head is normocephalic and atraumatic. EOMI are intact. Mouth is well hydrated and without lesions. NECK: Supple. No masses LUNGS: Clear to auscultation. No presence of rhonchi/wheezing/rales. Adequate chest expansion HEART: RRR, normal s1 and s2. ABDOMEN: Soft, nontender, no guarding, no peritoneal signs, and nondistended. BS +. No masses. EXTREMITIES: Without any cyanosis, clubbing, rash, lesions or edema. NEUROLOGIC: AOx3, no focal motor deficit. SKIN: no jaundice, no rashes  Assessment/Plan 72 y/o F with PMH GERD, coming for screening colonoscopy.  The patient is at average risk for colorectal cancer.  We will proceed with colonoscopy today.   Dolores Frame, MD 08/14/2022, 7:25 AM

## 2022-08-14 NOTE — Op Note (Signed)
[facility] Patient Name: Gloria Castro Procedure Date: 08/14/2022 7:08 AM MRN: [id number] Date of Birth: Aug 14, 1950 Attending MD: Katrinka Blazing , , [id number] CSN: [id number] Age: 72 Admit Type: Outpatient Procedure:                Colonoscopy Indications:              Screening for colorectal malignant neoplasm Providers:                Katrinka Blazing, Buel Ream. Thomasena Edis RN, RN, Lennice Sites Technician, Pensions consultant Referring MD:              Medicines:                Monitored Anesthesia Care Complications:            No immediate complications. Estimated Blood Loss:     Estimated blood loss: none. Procedure:                Pre-Anesthesia Assessment:                           - Prior to the procedure, a History and Physical                            was performed, and patient medications, allergies                            and sensitivities were reviewed. The patient's                            tolerance of previous anesthesia was reviewed.                           - The risks and benefits of the procedure and the                            sedation options and risks were discussed with the                            patient. All questions were answered and informed                            consent was obtained.                           - ASA Grade Assessment: II - A patient with mild                            systemic disease.                           After obtaining informed consent, the colonoscope                            was passed under direct vision. Throughout the  procedure, the patient's blood pressure, pulse, and                            oxygen saturations were monitored continuously. The                            [id number]-HQ190L ([id number]) scope was introduced through                            the anus and advanced to the the cecum, identified                            by appendiceal orifice and  ileocecal valve. The                            colonoscopy was performed without difficulty. The                            patient tolerated the procedure well. The quality                            of the bowel preparation was excellent. Scope In: 7:33:19 AM Scope Out: 7:54:09 AM Scope Withdrawal Time: 0 hours 14 minutes 26 seconds  Total Procedure Duration: 0 hours 20 minutes 50 seconds  Findings:      The perianal and digital rectal examinations were normal.      Two sessile polyps were found in the transverse colon and cecum. The       polyps were 2 to 5 mm in size. These polyps were removed with a cold       snare. Resection and retrieval were complete.      An 8 mm polyp was found in the sigmoid colon. The polyp was sessile. The       polyp was removed with a cold snare. Resection and retrieval were       complete.      The retroflexed view of the distal rectum and anal verge was normal and       showed no anal or rectal abnormalities. Impression:               - Two 2 to 5 mm polyps in the transverse colon and                            in the cecum, removed with a cold snare. Resected                            and retrieved.                           - One 8 mm polyp in the sigmoid colon, removed with                            a cold snare. Resected and retrieved.                           - The  distal rectum and anal verge are normal on                            retroflexion view. Moderate Sedation:      Per Anesthesia Care Recommendation:           - Discharge patient to home (ambulatory).                           - Resume previous diet.                           - Await pathology results.                           - Repeat colonoscopy for surveillance based on                            pathology results. Procedure Code(s):        --- Professional ---                           [phone], Colonoscopy, flexible; with removal of                            tumor(s),  polyp(s), or other lesion(s) by snare                            technique Diagnosis Code(s):        --- Professional ---                           Z12.11, Encounter for screening for malignant                            neoplasm of colon                           D12.3, Benign neoplasm of transverse colon (hepatic                            flexure or splenic flexure)                           D12.0, Benign neoplasm of cecum                           D12.5, Benign neoplasm of sigmoid colon CPT copyright 2022 [employer]. All rights reserved. The codes documented in this report are preliminary and upon coder review may  be revised to meet current compliance requirements. Katrinka Blazing, MD Katrinka Blazing,  08/14/2022 7:59:03 AM This report has been signed electronically. Number of Addenda: 0

## 2022-08-14 NOTE — Anesthesia Procedure Notes (Signed)
Date/Time: 08/14/2022 7:33 AM  Performed by: Julian Reil, CRNAPre-anesthesia Checklist: Patient identified, Emergency Drugs available, Suction available and Patient being monitored Patient Re-evaluated:Patient Re-evaluated prior to induction Oxygen Delivery Method: Nasal cannula Induction Type: IV induction Placement Confirmation: positive ETCO2

## 2022-08-15 NOTE — Anesthesia Postprocedure Evaluation (Signed)
Anesthesia Post Note  Patient: Gloria Castro  Procedure(s) Performed: COLONOSCOPY WITH PROPOFOL POLYPECTOMY  Patient location during evaluation: Phase II Anesthesia Type: General Level of consciousness: awake Pain management: pain level controlled Vital Signs Assessment: post-procedure vital signs reviewed and stable Respiratory status: spontaneous breathing and respiratory function stable Cardiovascular status: blood pressure returned to baseline and stable Postop Assessment: no headache and no apparent nausea or vomiting Anesthetic complications: no Comments: Late entry   No notable events documented.   Last Vitals:  Vitals:   08/14/22 0758 08/14/22 0802  BP: (!) 117/105 111/62  Pulse: 60 61  Resp: 18 16  Temp: 36.4 C   SpO2: 96% 96%    Last Pain:  Vitals:   08/14/22 0802  TempSrc:   PainSc: 0-No pain                 [name]

## 2022-08-17 ENCOUNTER — Encounter (INDEPENDENT_AMBULATORY_CARE_PROVIDER_SITE_OTHER): Payer: Self-pay | Admitting: *Deleted

## 2022-08-17 LAB — SURGICAL PATHOLOGY

## 2022-08-20 ENCOUNTER — Encounter (HOSPITAL_COMMUNITY): Payer: Self-pay | Admitting: Gastroenterology

## 2023-12-23 ENCOUNTER — Ambulatory Visit: Payer: Self-pay

## 2024-01-02 ENCOUNTER — Emergency Department (HOSPITAL_COMMUNITY)

## 2024-01-02 ENCOUNTER — Encounter (HOSPITAL_COMMUNITY): Payer: Self-pay | Admitting: *Deleted

## 2024-01-02 ENCOUNTER — Other Ambulatory Visit: Payer: Self-pay

## 2024-01-02 ENCOUNTER — Emergency Department (HOSPITAL_COMMUNITY)
Admission: EM | Admit: 2024-01-02 | Discharge: 2024-01-02 | Disposition: A | Discharge status: Home / Self Care | Attending: Emergency Medicine | Admitting: Emergency Medicine

## 2024-01-02 DIAGNOSIS — M545 Low back pain, unspecified: Secondary | ICD-10-CM | POA: Insufficient documentation

## 2024-01-02 DIAGNOSIS — K760 Fatty (change of) liver, not elsewhere classified: Secondary | ICD-10-CM | POA: Diagnosis not present

## 2024-01-02 DIAGNOSIS — R109 Unspecified abdominal pain: Secondary | ICD-10-CM | POA: Insufficient documentation

## 2024-01-02 LAB — COMPREHENSIVE METABOLIC PANEL WITH GFR
ALT: 17 U/L (ref 0–44)
AST: 16 U/L (ref 15–41)
Albumin: 3.9 g/dL (ref 3.5–5.0)
Alkaline Phosphatase: 67 U/L (ref 38–126)
Anion gap: 10 (ref 5–15)
BUN: 11 mg/dL (ref 8–23)
CO2: 27 mmol/L (ref 22–32)
Calcium: 8.9 mg/dL (ref 8.9–10.3)
Chloride: 100 mmol/L (ref 98–111)
Creatinine, Ser: 0.79 mg/dL (ref 0.44–1.00)
GFR, Estimated: >60 mL/min (ref >60)
Glucose, Bld: 133 mg/dL — ABNORMAL HIGH (ref 70–99)
Potassium: 3.5 mmol/L (ref 3.5–5.1)
Sodium: 137 mmol/L (ref 135–145)
Total Bilirubin: 1 mg/dL (ref 0.0–1.2)
Total Protein: 7.1 g/dL (ref 6.5–8.1)

## 2024-01-02 LAB — CBC WITH DIFFERENTIAL/PLATELET
Abs Immature Granulocytes: 0.03 K/uL (ref 0.00–0.07)
Basophils Absolute: 0 K/uL (ref 0.0–0.1)
Basophils Relative: 0 %
Eosinophils Absolute: 0.6 K/uL — ABNORMAL HIGH (ref 0.0–0.5)
Eosinophils Relative: 6 %
HCT: 40.3 % (ref 36.0–46.0)
Hemoglobin: 13.2 g/dL (ref 12.0–15.0)
Immature Granulocytes: 0 %
Lymphocytes Relative: 8 %
Lymphs Abs: 1 K/uL (ref 0.7–4.0)
MCH: 29.6 pg (ref 26.0–34.0)
MCHC: 32.8 g/dL (ref 30.0–36.0)
MCV: 90.4 fL (ref 80.0–100.0)
Monocytes Absolute: 1.3 K/uL — ABNORMAL HIGH (ref 0.1–1.0)
Monocytes Relative: 12 %
Neutro Abs: 8.6 K/uL — ABNORMAL HIGH (ref 1.7–7.7)
Neutrophils Relative %: 74 %
Platelets: 185 K/uL (ref 150–400)
RBC: 4.46 MIL/uL (ref 3.87–5.11)
RDW: 12.4 % (ref 11.5–15.5)
WBC: 11.6 K/uL — ABNORMAL HIGH (ref 4.0–10.5)
nRBC: 0 % (ref 0.0–0.2)

## 2024-01-02 LAB — URINALYSIS, ROUTINE W REFLEX MICROSCOPIC
Bacteria, UA: NONE SEEN
Bilirubin Urine: NEGATIVE
Glucose, UA: NEGATIVE mg/dL
Ketones, ur: NEGATIVE mg/dL
Leukocytes,Ua: NEGATIVE
Nitrite: NEGATIVE
Protein, ur: NEGATIVE mg/dL
Specific Gravity, Urine: 1.019 (ref 1.005–1.030)
pH: 5 (ref 5.0–8.0)

## 2024-01-02 LAB — LIPASE, BLOOD: Lipase: 30 U/L (ref 11–51)

## 2024-01-02 MED ORDER — HYDROCODONE-ACETAMINOPHEN 5-325 MG PO TABS
1.0000 | ORAL_TABLET | Freq: Once | ORAL | Status: AC
  Administered 2024-01-02: 1 via ORAL
  Filled 2024-01-02: qty 1

## 2024-01-02 MED ORDER — HYDROCODONE-ACETAMINOPHEN 5-325 MG PO TABS: ORAL_TABLET | ORAL | 0 refills | Status: DC

## 2024-01-02 MED ORDER — IOHEXOL 300 MG/ML  SOLN
100.0000 mL | Freq: Once | INTRAMUSCULAR | Status: AC | PRN
Start: 2024-01-02 — End: 2024-01-02
  Administered 2024-01-02: 100 mL via INTRAVENOUS

## 2024-01-02 MED ORDER — NAPROXEN 500 MG PO TABS: 500.0000 mg | ORAL_TABLET | Freq: Two times a day (BID) | ORAL | 0 refills | Status: AC

## 2024-01-02 NOTE — ED Triage Notes (Signed)
 Pt seen UC recently and dx'd with UTI and has finished the antibiotic, pt states back pain got better after starting the antibiotic but returned on Friday. Pt denies any fevers or burning with urination, + chills.  Pt denies hx of kidney stones.

## 2024-01-02 NOTE — ED Notes (Signed)
 Pt taken to ct

## 2024-01-02 NOTE — ED Provider Notes (Signed)
 [place] [facility]ERGENCY DEPARTMENT AT [facility] Provider Note   CSN: [id number] Arrival date & time: 01/02/24  1127     Patient presents with: Back Pain   Gloria Castro is a 73 y.o. female.    Back Pain Associated symptoms: abdominal pain   Associated symptoms: no chest pain, no dysuria, no fever, no numbness and no weakness        Gloria Castro is a 73 y.o. female past medical history of GERD and mitral valve prolapse who presents to the Emergency Department complaining of diffuse lower back pain and pain radiating into her pelvis.  She began having symptoms last week of right flank pain.  She was seen at an urgent care and diagnosed with UTI.  She completed a course of antibiotics, and states pain improved while taking the medication but returned 2 days ago.  She denies having fevers, nausea or vomiting, diarrhea, pain numbness or weakness of the lower extremities, urine or bowel changes.  No recent injury or spinal procedures.  No history of kidney stones   Prior to Admission medications   Medication Sig Start Date End Date Taking? Authorizing Provider  acetaminophen (TYLENOL) 500 MG tablet Take 1,000 mg by mouth every 6 (six) hours as needed for moderate pain.    [provider]  atorvastatin (LIPITOR) 20 MG tablet Take 10 mg by mouth 4 (four) times a week.    [provider]  Brimonidine Tartrate (LUMIFY) 0.025 % SOLN Place 1 drop into both eyes daily as needed (Redness).    [provider]  cetirizine (ZYRTEC) 10 MG tablet Take 10 mg by mouth daily.    [provider]  Cholecalciferol (VITAMIN D) 125 MCG (5000 UT) CAPS Take 5,000 Units by mouth daily.    [provider]  estradiol (ESTRACE) 0.1 MG/GM vaginal cream Place 1 Applicatorful vaginally 2 (two) times a week.    [provider]  famotidine (PEPCID) 20 MG tablet Take 20 mg by mouth daily.    [provider]  Melatonin 10 MG TABS Take 10 mg  by mouth at bedtime.    [provider]  oxymetazoline (AFRIN) 0.05 % nasal spray Place 1 spray into both nostrils 2 (two) times daily as needed for congestion.    [provider]  Turmeric 500 MG CAPS Take 500 mg by mouth daily.    [provider]    Allergies: Augmentin [amoxicillin-pot clavulanate]    Review of Systems  Constitutional:  Negative for appetite change and fever.  Respiratory:  Negative for shortness of breath.   Cardiovascular:  Negative for chest pain.  Gastrointestinal:  Positive for abdominal pain. Negative for nausea and vomiting.  Genitourinary:  Negative for difficulty urinating, dysuria, vaginal bleeding and vaginal discharge.  Musculoskeletal:  Positive for back pain.  Skin:  Negative for rash.  Neurological:  Negative for weakness and numbness.    Updated Vital Signs BP 136/73 (BP Location: Right Arm)   Pulse 83   Temp 99.7 F (37.6 C) (Oral)   Resp 18   Ht 5' 7 (1.702 m)   Wt 83.9 kg   SpO2 94%   BMI 28.98 kg/m   Physical Exam Vitals and nursing note reviewed.  Constitutional:      General: She is not in acute distress.    Appearance: Normal appearance. She is not toxic-appearing.  Cardiovascular:     Rate and Rhythm: Normal rate and regular rhythm.     Pulses: Normal  pulses.  Pulmonary:     Effort: Pulmonary effort is normal.     Breath sounds: Normal breath sounds.  Abdominal:     General: There is no distension.     Palpations: Abdomen is soft.     Tenderness: There is no abdominal tenderness. There is no right CVA tenderness, left CVA tenderness or guarding.  Musculoskeletal:        General: Normal range of motion.     Lumbar back: Tenderness present. No swelling. Normal range of motion. Negative right straight leg raise test and negative left straight leg raise test.     Comments: Diffuse tenderness to palpation lower bilateral lumbar paraspinal muscles.  Hip flexors and extensors are intact.  Negative  straight leg raise bilaterally.  No midline tenderness  Skin:    General: Skin is warm.     Capillary Refill: Capillary refill takes less than 2 seconds.  Neurological:     General: No focal deficit present.     Mental Status: She is alert.     Sensory: No sensory deficit.     Motor: No weakness.     (all labs ordered are listed, but only abnormal results are displayed) Labs Reviewed  URINALYSIS, ROUTINE W REFLEX MICROSCOPIC - Abnormal; Notable for the following components:      Result Value   APPearance HAZY (*)    Hgb urine dipstick MODERATE (*)    All other components within normal limits  CBC WITH DIFFERENTIAL/PLATELET - Abnormal; Notable for the following components:   WBC 11.6 (*)    Neutro Abs 8.6 (*)    Monocytes Absolute 1.3 (*)    Eosinophils Absolute 0.6 (*)    All other components within normal limits  COMPREHENSIVE METABOLIC PANEL WITH GFR - Abnormal; Notable for the following components:   Glucose, Bld 133 (*)    All other components within normal limits  LIPASE, BLOOD    EKG: None  Radiology: CT L-SPINE NO CHARGE Result Date: 01/02/2024 EXAM: CT OF THE LUMBAR SPINE WITHOUT CONTRAST 01/02/2024 07:25:05 PM TECHNIQUE: CT of the lumbar spine was performed without the administration of intravenous contrast. Multiplanar reformatted images are provided for review. Automated exposure control, iterative reconstruction, and/or weight based adjustment of the mA/kV was utilized to reduce the radiation dose to as low as reasonably achievable. COMPARISON: None available. CLINICAL HISTORY: Pt seen UC recently and dx'd with UTI and has finished the antibiotic, pt states back pain got better after starting the antibiotic but returned on Friday. Pt denies any fevers or burning with urination, + chills. Pt denies hx of kidney stones. FINDINGS: BONES AND ALIGNMENT: Normal vertebral body heights. No acute fracture or suspicious bone lesion. Normal alignment. DEGENERATIVE CHANGES: Mild  multilevel spondylosis. No severe spinal canal narrowing. Moderate neural foraminal narrowing on the left at L4-L5 and L5-S1. SOFT TISSUES: No acute abnormality. IMPRESSION: 1. No acute fracture. Electronically signed by: Norman Gatlin MD 01/02/2024 07:33 PM EDT RP Workstation: [id number]   CT ABDOMEN PELVIS W CONTRAST Result Date: 01/02/2024 EXAM: CT ABDOMEN AND PELVIS WITH CONTRAST 01/02/2024 07:25:05 PM TECHNIQUE: CT of the abdomen and pelvis was performed with the administration of 100 mL of iohexol (OMNIPAQUE) 300 MG/ML solution. Multiplanar reformatted images are provided for review. Automated exposure control, iterative reconstruction, and/or weight-based adjustment of the mA/kV was utilized to reduce the radiation dose to as low as reasonably achievable. COMPARISON: CT abdomen and pelvis 07/21/2013. CLINICAL HISTORY: Abdominal pain, acute, nonlocalized; Abdominal/flank pain, stone suspected; low back, right flank  and bilateral lower abd pain. Pt seen UC recently and dx'd with UTI and has finished the antibiotic, pt states back pain got better after starting the antibiotic but returned on Friday. Pt denies any fevers or burning with urination, + chills. Pt denies hx of kidney stones. FINDINGS: LOWER CHEST: No acute abnormality. LIVER: Hepatic steatosis. GALLBLADDER AND BILE DUCTS: Gallbladder is unremarkable. No biliary ductal dilatation. SPLEEN: No acute abnormality. PANCREAS: No acute abnormality. ADRENAL GLANDS: No acute abnormality. KIDNEYS, URETERS AND BLADDER: No stones in the kidneys or ureters. No hydronephrosis. No perinephric or periureteral stranding. Urinary bladder is unremarkable. GI AND BOWEL: Stomach demonstrates no acute abnormality. There is no bowel obstruction. PERITONEUM AND RETROPERITONEUM: No ascites. No free air. VASCULATURE: Aorta is normal in caliber. LYMPH NODES: No lymphadenopathy. REPRODUCTIVE ORGANS: No acute abnormality. BONES AND SOFT TISSUES: No acute osseous abnormality.  No focal soft tissue abnormality. IMPRESSION: 1. No acute findings in the abdomen or pelvis. 2. Hepatic steatosis. Electronically signed by: Norman Gatlin MD 01/02/2024 07:30 PM EDT RP Workstation: [id number]     Procedures   Medications Ordered in the ED  iohexol (OMNIPAQUE) 300 MG/ML solution 100 mL (100 mLs Intravenous Contrast Given 01/02/24 1911)  HYDROcodone-acetaminophen (NORCO/VICODIN) 5-325 MG per tablet 1 tablet (1 tablet Oral Given 01/02/24 2024)                                    Medical Decision Making Patient here with diffuse low back pain and pelvic pressure/discomfort for several days.  Initially seen in urgent care and prescribed antibiotic for UTI.  Patient endorses some improvement while taking antibiotic but pain returned 2 days ago.  No aggravating or alleviating factors.  She denies any radicular symptoms, saddle anesthesias urine or bowel changes.  No previous spinal procedures or surgeries  Vital signs are reassuring, differential would include but not limited to MSK, kidney stone, pyelonephritis, cauda equina and spinal abscess also considered but felt less likely  Amount and/or Complexity of Data Reviewed Labs: ordered.    Details: Labs no significant leukocytosis, chemistries without derangement, urinalysis shows hazy urine with moderate blood no bacteria or leukocytes Radiology: ordered.    Details: CT abdomen and pelvis without acute findings in the abdomen or pelvis.  CT L-spine no charge shows some multilevel spondylosis without severe canal narrowing or fracture Discussion of management or test interpretation with external provider(s): Patient is ambulated in the department with steady gait.  No focal neurodeficits on my exam.  No red flags on exam for cauda equina.  She has full range of motion of bilateral lower extremities.  The cause of her lower back pain is unclear Clinically, I have low suspicion for cauda equina or spinal abscess.  She was recently  treated with antibiotics for UTI.  She does have some hematuria, her back pain possibly related to recently passed kidney stone versus MSK.  Patient is requesting discharge home, she agrees to close outpatient follow-up with her PCP in the next 1 to 2 days.  I have given strict return precautions as well.  Risk Prescription drug management.        Final diagnoses:  Acute bilateral low back pain without sciatica    ED Discharge Orders     None          Herlinda Milling, PA-C 01/03/24 1435    Francesca Elsie CROME, MD 01/13/24 1545

## 2024-01-02 NOTE — Discharge Instructions (Signed)
 You may try alternating ice and heat to your lower back.  Avoid twisting bending or heavy lifting for at least 1 week.  You have been prescribed pain medication which can cause drowsiness.  Please do not operate machinery or drive while taking this medication.  It can also cause some constipation so I recommend that you take a stool softener while taking the pain medication.  Please follow-up with your primary care provider for recheck this week, as discussed please return to the emergency department if you develop any new or worsening symptoms

## 2024-01-02 NOTE — ED Notes (Signed)
 See triage notes.pt a/o. Ambulatory. Nad. States pain in back radiates around both sides.

## 2024-01-19 ENCOUNTER — Encounter (INDEPENDENT_AMBULATORY_CARE_PROVIDER_SITE_OTHER): Payer: Self-pay | Admitting: Gastroenterology

## 2024-01-31 ENCOUNTER — Encounter (INDEPENDENT_AMBULATORY_CARE_PROVIDER_SITE_OTHER): Payer: Self-pay | Admitting: *Deleted

## 2024-02-24 ENCOUNTER — Ambulatory Visit (INDEPENDENT_AMBULATORY_CARE_PROVIDER_SITE_OTHER): Payer: PRIVATE HEALTH INSURANCE | Admitting: Gastroenterology

## 2024-02-24 ENCOUNTER — Encounter (INDEPENDENT_AMBULATORY_CARE_PROVIDER_SITE_OTHER): Payer: Self-pay | Admitting: Gastroenterology

## 2024-02-24 VITALS — BP 103/65 | HR 80 | Temp 97.5°F | Ht 67.0 in | Wt 192.5 lb

## 2024-02-24 DIAGNOSIS — K219 Gastro-esophageal reflux disease without esophagitis: Secondary | ICD-10-CM | POA: Diagnosis not present

## 2024-02-24 DIAGNOSIS — R059 Cough, unspecified: Secondary | ICD-10-CM

## 2024-02-24 NOTE — Patient Instructions (Addendum)
 Increase omeprazole to 40 mg qday. Explained presumed etiology of reflux symptoms. Instruction provided in the use of antireflux medication - patient should take medication in the morning 30-45 minutes before eating breakfast. Discussed avoidance of eating within 2 hours of lying down to sleep and benefit of blocks to elevate head of bed. Also, will benefit from avoiding carbonated drinks/sodas or food that has tomatoes, spicy or greasy food. Continue Pepcid 20 mg at night

## 2024-02-24 NOTE — Progress Notes (Signed)
 Toribio Fortune, M.D. Gastroenterology & Hepatology [facility] [address] [place], [place] [address]  Primary Care Physician: Con Rake, FNP [address] [place] [address]  I will communicate my assessment and recommendations to the referring MD via EMR.  Problems: Cough  GERD  History of Present Illness: ILIANNA BOWN is a 73 y.o. female with past medical history of GERD, who presents for follow up of GERD and evaluation of cough.  Patient reports that for the las 3-4 months she presents recurrent issues with coughing and discomfort in her throat intermittently. She does not produce any sputum.  Not presenting heartburn, odynophagia or dysphagia. She reports these symptoms feel similar to a flare of GERD she had in the past, for which she was seen by Dr. Porter and stated on PPI. She saw Dr. Golda as well for similar symptoms.  Patient has been taking famotidine 20 OTC and recented Prilosec 20 mg at night. Has been taking Prilosec for the last couple of weeks, has not noticed too much difference.  The patient denies having any nausea, vomiting, fever, chills, hematochezia, melena, hematemesis, abdominal distention, abdominal pain, diarrhea, jaundice, pruritus or weight loss.  Last EGD:08/19/2016 - Normal proximal esophagus and mid esophagus. - Salmon- colored mucosa suspicious for short- segment Barrett' s esophagus. Biopsied but neg for BE. - Z- line irregular, 39 cm from the incisors. - Normal stomach. - Normal duodenal bulb and second portion of the duodenum.  Last Colonoscopy: 08/2022 - Two 2 to 5 mm polyps in the transverse colon and in the cecum, removed with a cold snare. Resected and retrieved. - One 8 mm polyp in the sigmoid colon, removed with a cold snare. Resected and retrieved.  Pathology showed 3 colonic tubular adenomas, repeat colonoscopy in 5 years   Past Medical History: Past Medical History:   Diagnosis Date   Colon polyps    GERD (gastroesophageal reflux disease)    MVP (mitral valve prolapse)     Past Surgical History: Past Surgical History:  Procedure Laterality Date   APPENDECTOMY     COLONOSCOPY WITH PROPOFOL  N/A 08/14/2022   Procedure: COLONOSCOPY WITH PROPOFOL ;  Surgeon: Fortune Angelia Toribio, MD;  [facility]: [facility];  Service: Gastroenterology;  Laterality: N/A;  7:30AM; ASA 1-2   ESOPHAGOGASTRODUODENOSCOPY N/A 08/19/2016   Procedure: ESOPHAGOGASTRODUODENOSCOPY (EGD);  Surgeon: Golda Claudis PENNER, MD;  Location: [facility];  Service: Endoscopy;  Laterality: N/A;  1200   POLYPECTOMY  08/14/2022   Procedure: POLYPECTOMY;  Surgeon: Fortune Angelia Toribio, MD;  Location: [facility];  Service: Gastroenterology;;   TONSILLECTOMY      Family History:History reviewed. No pertinent family history.  Social History: Social History   Tobacco Use  Smoking Status Never  Smokeless Tobacco Never   Social History   Substance and Sexual Activity  Alcohol Use No   Social History   Substance and Sexual Activity  Drug Use No    Allergies: Allergies  Allergen Reactions   Augmentin [Amoxicillin-Pot Clavulanate] Hives    Medications: Current Outpatient Medications  Medication Sig Dispense Refill   atorvastatin (LIPITOR) 20 MG tablet Take 10 mg by mouth 4 (four) times a week. (Patient taking differently: Take 10 mg by mouth 3 (three) times a week.)     Brimonidine Tartrate (LUMIFY) 0.025 % SOLN Place 1 drop into both eyes daily as needed (Redness).     cetirizine (ZYRTEC) 10 MG tablet Take 10 mg by mouth daily.  Cholecalciferol (VITAMIN D) 125 MCG (5000 UT) CAPS Take 5,000 Units by mouth daily.     estradiol (ESTRACE) 0.1 MG/GM vaginal cream Place 1 Applicatorful vaginally 2 (two) times a week.     famotidine (PEPCID) 20 MG tablet Take 20 mg by mouth daily.     naproxen (NAPROSYN) 500 MG tablet Take 1 tablet (500 mg total) by mouth 2 (two) times  daily. Take with food (Patient taking differently: Take 500 mg by mouth as needed. Take with food) 14 tablet 0   omeprazole (PRILOSEC OTC) 20 MG tablet Take 20 mg by mouth at bedtime.     oxymetazoline (AFRIN) 0.05 % nasal spray Place 1 spray into both nostrils 2 (two) times daily as needed for congestion.     Turmeric 500 MG CAPS Take 500 mg by mouth daily.     No current facility-administered medications for this visit.    Review of Systems: GENERAL: negative for malaise, night sweats HEENT: No changes in hearing or vision, no nose bleeds or other nasal problems. NECK: Negative for lumps, goiter, pain and significant neck swelling RESPIRATORY: Negative for cough, wheezing CARDIOVASCULAR: Negative for chest pain, leg swelling, palpitations, orthopnea GI: SEE HPI MUSCULOSKELETAL: Negative for joint pain or swelling, back pain, and muscle pain. SKIN: Negative for lesions, rash PSYCH: Negative for sleep disturbance, mood disorder and recent psychosocial stressors. HEMATOLOGY Negative for prolonged bleeding, bruising easily, and swollen nodes. ENDOCRINE: Negative for cold or heat intolerance, polyuria, polydipsia and goiter. NEURO: negative for tremor, gait imbalance, syncope and seizures. The remainder of the review of systems is noncontributory.   Physical Exam: BP 103/65 (BP Location: Left Arm, Patient Position: Sitting, Cuff Size: Normal)   Pulse 80   Temp (!) 97.5 F (36.4 C) (Temporal)   Ht 5' 7 (1.702 m)   Wt 192 lb 8 oz (87.3 kg)   BMI 30.15 kg/m  GENERAL: The patient is AO x3, in no acute distress. HEENT: Head is normocephalic and atraumatic. EOMI are intact. Mouth is well hydrated and without lesions. NECK: Supple. No masses LUNGS: Clear to auscultation. No presence of rhonchi/wheezing/rales. Adequate chest expansion HEART: RRR, normal s1 and s2. ABDOMEN: Soft, nontender, no guarding, no peritoneal signs, and nondistended. BS +. No masses. EXTREMITIES: Without any  cyanosis, clubbing, rash, lesions or edema. NEUROLOGIC: AOx3, no focal motor deficit. SKIN: no jaundice, no rashes  Imaging/Labs: as above  I personally reviewed and interpreted the available labs, imaging and endoscopic files.  Impression and Plan: CARNETTA LOSADA is a 73 y.o. female with past medical history of GERD, who presents for follow up of GERD and evaluation of cough.  Patient reported worsening of coughing spells and throat discomfort recently despite taking over-the-counter Pepcid and Prilosec.  She denies typical GERD symptoms, although she reports having similar symptoms many years ago that responded to the use of PPI.  We will treat this for now as possible worsening of her GERD with higher dose omeprazole.  I also advised her on the proper fashion to take this medication (morning).  We discussed that if her symptoms were to persist, we will need to proceed with EGD with [facility] off PPI.  -Increase omeprazole to 40 mg qday. -Explained presumed etiology of reflux symptoms. Instruction provided in the use of antireflux medication - patient should take medication in the morning 30-45 minutes before eating breakfast. Discussed avoidance of eating within 2 hours of lying down to sleep and benefit of blocks to elevate head of bed. Also, will  benefit from avoiding carbonated drinks/sodas or food that has tomatoes, spicy or greasy food. -Continue Pepcid 20 mg at night  All questions were answered.      Toribio Fortune, MD Gastroenterology and Hepatology [facility]

## 2024-09-06 ENCOUNTER — Ambulatory Visit: Payer: PRIVATE HEALTH INSURANCE | Admitting: Cardiology
